# Patient Record
Sex: Female | Born: 1975 | Race: Asian | Hispanic: No | Marital: Married | Smoking: Never smoker
Health system: Southern US, Community
[De-identification: ages and names within clinical notes are randomized; demographics above are authoritative.]

## PROBLEM LIST (undated history)

## (undated) DIAGNOSIS — I10 Essential (primary) hypertension: Secondary | ICD-10-CM

## (undated) DIAGNOSIS — N159 Renal tubulo-interstitial disease, unspecified: Secondary | ICD-10-CM

---

## 2018-01-17 ENCOUNTER — Emergency Department (HOSPITAL_COMMUNITY): Payer: Self-pay

## 2018-01-17 ENCOUNTER — Emergency Department (HOSPITAL_COMMUNITY)
Admission: EM | Admit: 2018-01-17 | Discharge: 2018-01-17 | Disposition: A | Discharge status: Home / Self Care | Payer: Self-pay | Attending: Emergency Medicine | Admitting: Emergency Medicine

## 2018-01-17 ENCOUNTER — Encounter (HOSPITAL_COMMUNITY): Payer: Self-pay | Admitting: Emergency Medicine

## 2018-01-17 ENCOUNTER — Other Ambulatory Visit: Payer: Self-pay

## 2018-01-17 DIAGNOSIS — I10 Essential (primary) hypertension: Secondary | ICD-10-CM | POA: Insufficient documentation

## 2018-01-17 DIAGNOSIS — R0789 Other chest pain: Secondary | ICD-10-CM | POA: Insufficient documentation

## 2018-01-17 DIAGNOSIS — R51 Headache: Secondary | ICD-10-CM | POA: Insufficient documentation

## 2018-01-17 DIAGNOSIS — Z79899 Other long term (current) drug therapy: Secondary | ICD-10-CM | POA: Insufficient documentation

## 2018-01-17 DIAGNOSIS — R519 Headache, unspecified: Secondary | ICD-10-CM

## 2018-01-17 HISTORY — DX: Essential (primary) hypertension: I10

## 2018-01-17 HISTORY — DX: Renal tubulo-interstitial disease, unspecified: N15.9

## 2018-01-17 LAB — URINALYSIS, ROUTINE W REFLEX MICROSCOPIC
Bilirubin Urine: NEGATIVE
Glucose, UA: NEGATIVE mg/dL
Hgb urine dipstick: NEGATIVE
Ketones, ur: 5 mg/dL — AB
Leukocytes, UA: NEGATIVE
Nitrite: NEGATIVE
Protein, ur: NEGATIVE mg/dL
Specific Gravity, Urine: 1.012 (ref 1.005–1.030)
pH: 5 (ref 5.0–8.0)

## 2018-01-17 LAB — CBC
HCT: 44.8 % (ref 36.0–46.0)
Hemoglobin: 13.8 g/dL (ref 12.0–15.0)
MCH: 26.5 pg (ref 26.0–34.0)
MCHC: 30.8 g/dL (ref 30.0–36.0)
MCV: 86 fL (ref 80.0–100.0)
Platelets: 281 10*3/uL (ref 150–400)
RBC: 5.21 MIL/uL — ABNORMAL HIGH (ref 3.87–5.11)
RDW: 11.9 % (ref 11.5–15.5)
WBC: 8.4 10*3/uL (ref 4.0–10.5)
nRBC: 0 % (ref 0.0–0.2)

## 2018-01-17 LAB — DIFFERENTIAL
Abs Immature Granulocytes: 0.02 10*3/uL (ref 0.00–0.07)
Basophils Absolute: 0 10*3/uL (ref 0.0–0.1)
Basophils Relative: 0 %
EOS ABS: 0 10*3/uL (ref 0.0–0.5)
EOS PCT: 0 %
Immature Granulocytes: 0 %
LYMPHS ABS: 1.6 10*3/uL (ref 0.7–4.0)
LYMPHS PCT: 19 %
MONOS PCT: 5 %
Monocytes Absolute: 0.4 10*3/uL (ref 0.1–1.0)
Neutro Abs: 6.4 10*3/uL (ref 1.7–7.7)
Neutrophils Relative %: 76 %

## 2018-01-17 LAB — COMPREHENSIVE METABOLIC PANEL
ALK PHOS: 58 U/L (ref 38–126)
ALT: 11 U/L (ref 0–44)
ANION GAP: 9 (ref 5–15)
AST: 19 U/L (ref 15–41)
Albumin: 4.3 g/dL (ref 3.5–5.0)
BILIRUBIN TOTAL: 0.6 mg/dL (ref 0.3–1.2)
BUN: 10 mg/dL (ref 6–20)
CALCIUM: 9.3 mg/dL (ref 8.9–10.3)
CO2: 24 mmol/L (ref 22–32)
Chloride: 104 mmol/L (ref 98–111)
Creatinine, Ser: 0.76 mg/dL (ref 0.44–1.00)
GFR calc Af Amer: >60 mL/min (ref >60)
Glucose, Bld: 117 mg/dL — ABNORMAL HIGH (ref 70–99)
POTASSIUM: 3.4 mmol/L — AB (ref 3.5–5.1)
Sodium: 137 mmol/L (ref 135–145)
TOTAL PROTEIN: 7.5 g/dL (ref 6.5–8.1)

## 2018-01-17 LAB — I-STAT TROPONIN, ED
TROPONIN I, POC: 0 ng/mL (ref 0.00–0.08)
Troponin i, poc: 0 ng/mL (ref 0.00–0.08)

## 2018-01-17 LAB — APTT: aPTT: 30 seconds (ref 24–36)

## 2018-01-17 LAB — PROTIME-INR
INR: 1.01
PROTHROMBIN TIME: 13.2 s (ref 11.4–15.2)

## 2018-01-17 LAB — I-STAT BETA HCG BLOOD, ED (MC, WL, AP ONLY): I-stat hCG, quantitative: <5 m[IU]/mL (ref <5)

## 2018-01-17 LAB — TSH: TSH: 0.971 u[IU]/mL (ref 0.350–4.500)

## 2018-01-17 LAB — LIPASE, BLOOD: Lipase: 23 U/L (ref 11–51)

## 2018-01-17 MED ORDER — GI COCKTAIL ~~LOC~~
30.0000 mL | Freq: Once | ORAL | Status: AC
  Administered 2018-01-17: 30 mL via ORAL
  Filled 2018-01-17: qty 30

## 2018-01-17 MED ORDER — SODIUM CHLORIDE 0.9 % IV BOLUS
1000.0000 mL | Freq: Once | INTRAVENOUS | Status: AC
  Administered 2018-01-17: 1000 mL via INTRAVENOUS

## 2018-01-17 MED ORDER — KETOROLAC TROMETHAMINE 30 MG/ML IJ SOLN
30.0000 mg | Freq: Once | INTRAMUSCULAR | Status: AC
  Administered 2018-01-17: 30 mg via INTRAVENOUS
  Filled 2018-01-17: qty 1

## 2018-01-17 NOTE — ED Provider Notes (Signed)
[facility] EMERGENCY DEPARTMENT Provider Note   CSN: [id number] Arrival date & time: 01/17/18  1618     History   Chief Complaint Chief Complaint  Patient presents with  . Hypertension  . Numbness    HPI Sokha Craker is a 42 y.o. female with history of hypertension presents for evaluation of gradual onset, progressively worsening headache, chest pain, and abdominal pain at around 4 AM this morning.  She notes a burning pain to the upper abdomen radiating up to the left side of the chest and left shoulder.  She also notes some shortness of breath that has worsened throughout the day.  Headache is in the occipital region radiating to the forehead and is described as an aching pressure.  She states she feels as though her head is "heavy ". Headache is more severe than headaches she has experienced previously. Denies blurry vision or double vision but does note seeing bright flashes of light when she closes her eyes.  Also notes numbness of the left handShe endorses nausea but no vomiting.  She has been on Keflex for the past 8 days due to a UTI.  No urinary symptoms at this time.  She took an aspirin earlier today without significant relief in her symptoms.  She feels as though her heart is racing.  She states this has been an ongoing problem for several months since an admission to the hospital in [facility] in which she was given an unknown medication and her heart rate increased to 150 bpm.  She does take blood pressure medicine but ran out of it 2 or 3 days ago.  She does not remember what medication she is on.  She denies alcohol use, recreational drug use, no significant caffeine intake.  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  The history is provided by the patient and the spouse. The history is limited by a language barrier. A language interpreter was used.    Past Medical History:  Diagnosis Date  .  Hypertension   . Kidney infection     There are no active problems to display for this patient.   History reviewed. No pertinent surgical history.   OB History   None      Home Medications    Prior to Admission medications   Medication Sig Start Date End Date Taking? Authorizing Provider  acetaminophen (TYLENOL) 500 MG tablet Take 1,000 mg by mouth every 6 (six) hours as needed for headache.   Yes [provider]  aspirin EC 81 MG tablet Take 81 mg by mouth daily.   Yes [provider]  cephALEXin (KEFLEX) 500 MG capsule Take 500 mg by mouth every 6 (six) hours. 01/08/18  Yes [provider]  Pseudoephedrine-APAP-DM (DAYQUIL MULTI-SYMPTOM COLD/FLU PO) Take 2 tablets by mouth daily.   Yes [provider]    Family History No family history on file.  Social History Social History   Tobacco Use  . Smoking status: Never Smoker  . Smokeless tobacco: Never Used  Substance Use Topics  . Alcohol use: Never    Frequency: Never  . Drug use: Never     Allergies   Omeprazole   Review of Systems Review of Systems  Constitutional: Negative for chills and fever.  Eyes: Positive for visual disturbance. Negative for photophobia and pain.  Respiratory: Positive for shortness of breath.   Cardiovascular: Positive for chest pain and palpitations. Negative for leg swelling.  Gastrointestinal:  Positive for abdominal pain and nausea. Negative for vomiting.  Genitourinary: Negative for dysuria, hematuria and urgency.  Neurological: Positive for numbness and headaches.  All other systems reviewed and are negative.    Physical Exam Updated Vital Signs BP (!) 142/102   Pulse 97   Temp 98.9 F (37.2 C) (Oral)   Resp 13   Ht 5\' 4"  (1.626 m)   Wt 63.5 kg   LMP 01/03/2018   SpO2 100%   BMI 24.03 kg/m   Physical Exam  Constitutional: She appears well-developed and well-nourished. No distress.  HENT:  Head: Normocephalic and atraumatic.    Eyes: Pupils are equal, round, and reactive to light. Conjunctivae and EOM are normal. Right eye exhibits no discharge. Left eye exhibits no discharge.  Neck: Normal range of motion. Neck supple. No JVD present. No tracheal deviation present.  No midline spine TTP, no deformity, crepitus, or step-off noted. Mild tenderness to palpation at the splenius capitis insertion point bilaterally along the occipital region   Cardiovascular: Normal rate, regular rhythm, normal heart sounds and intact distal pulses.  2+ radial and DP/PT pulses bilaterally, Homans sign absent bilaterally, no lower extremity edema, no palpable cords, compartments are soft   Pulmonary/Chest: Effort normal and breath sounds normal. She exhibits no tenderness.  Abdominal: Soft. Bowel sounds are normal. She exhibits no distension. There is tenderness in the epigastric area. There is no rigidity, no rebound, no guarding and no CVA tenderness.  Musculoskeletal: She exhibits no edema.  No midline spine TTP, no paraspinal muscle tenderness, no deformity, crepitus, or step-off noted.  5/5 strength of BUE and BLE major muscle groups.  Neurological: She is alert. No cranial nerve deficit or sensory deficit. She exhibits normal muscle tone. Coordination normal.  Mental Status:  Alert, thought content appropriate, able to give a coherent history. Speech fluent without evidence of aphasia. Able to follow 2 step commands without difficulty.  Cranial Nerves:  II:  Peripheral visual fields grossly normal, pupils equal, round, reactive to light III,IV, VI: ptosis not present, extra-ocular motions intact bilaterally  V,VII: smile symmetric, facial light touch sensation equal VIII: hearing grossly normal to voice  X: uvula elevates symmetrically  XI: bilateral shoulder shrug symmetric and strong XII: midline tongue extension without fassiculations Motor:  Normal tone. 5/5 strength of BUE and BLE major muscle groups including strong and equal  grip strength and dorsiflexion/plantar flexion Sensory: light touch normal in all extremities. Cerebellar: normal finger-to-nose with bilateral upper extremities Gait: normal gait and balance. Able to walk on toes and heels with ease.  No pronator drift, Romberg sign absent   Skin: Skin is warm and dry. No erythema.  Psychiatric: She has a normal mood and affect. Her behavior is normal.  Nursing note and vitals reviewed.    ED Treatments / Results  Labs (all labs ordered are listed, but only abnormal results are displayed) Labs Reviewed  CBC - Abnormal; Notable for the following components:      Result Value   RBC 5.21 (*)    All other components within normal limits  COMPREHENSIVE METABOLIC PANEL - Abnormal; Notable for the following components:   Potassium 3.4 (*)    Glucose, Bld 117 (*)    All other components within normal limits  URINALYSIS, ROUTINE W REFLEX MICROSCOPIC - Abnormal; Notable for the following components:   Color, Urine STRAW (*)    Ketones, ur 5 (*)    All other components within normal limits  PROTIME-INR  APTT  DIFFERENTIAL  TSH  LIPASE, BLOOD  I-STAT TROPONIN, ED  I-STAT BETA HCG BLOOD, ED ([facility], [facility], [facility] ONLY)  I-STAT TROPONIN, ED    EKG EKG Interpretation  Date/Time:  Sunday January 17 2018 16:38:13 EDT Ventricular Rate:  97 PR Interval:  152 QRS Duration: 78 QT Interval:  362 QTC Calculation: 459 R Axis:   56 Text Interpretation:  Normal sinus rhythm Nonspecific T wave abnormality Abnormal ECG No previous ECGs available Confirmed by Vanetta Mulders [phone]) on 01/17/2018 4:47:42 PM   Radiology Dg Chest 2 View  Result Date: 01/17/2018 CLINICAL DATA:  Chest pain.  High blood pressure. EXAM: CHEST - 2 VIEW COMPARISON:  None. FINDINGS: The heart size is within normal limits. Ectasia of thoracic aorta noted. Both lungs are clear. The visualized skeletal structures are unremarkable. IMPRESSION: No active cardiopulmonary disease. Electronically  Signed   By: Myles Rosenthal M.D.   On: 01/17/2018 19:31   Ct Head Wo Contrast  Result Date: 01/17/2018 CLINICAL DATA:  C/o blood pressure 178/ at home. States she has had issues with BP being elevated over the past 2-3 months. Not taking BP meds. Also reports headache/ "heavines to head" over the past 2 days. Reports numbness to L hand since waking up at 4am. No arm drift. Speech clear. EXAM: CT HEAD WITHOUT CONTRAST TECHNIQUE: Contiguous axial images were obtained from the base of the skull through the vertex without intravenous contrast. COMPARISON:  None. FINDINGS: Brain: No evidence of acute infarction, hemorrhage, hydrocephalus, extra-axial collection or mass lesion/mass effect. Vascular: No hyperdense vessel or unexpected calcification. Skull: Normal. Negative for fracture or focal lesion. Sinuses/Orbits: Globes and orbits are unremarkable. Visualized sinuses and mastoid air cells are clear. Other: None. IMPRESSION: Normal unenhanced CT scan of the brain. Electronically Signed   By: Amie Portland M.D.   On: 01/17/2018 17:00    Procedures Procedures (including critical care time)  Medications Ordered in ED Medications  gi cocktail (Maalox,Lidocaine,Donnatal) (30 mLs Oral Given 01/17/18 1848)  ketorolac (TORADOL) 30 MG/ML injection 30 mg (30 mg Intravenous Given 01/17/18 1848)  sodium chloride 0.9 % bolus 1,000 mL (0 mLs Intravenous Stopped 01/17/18 2119)     Initial Impression / Assessment and Plan / ED Course  I have reviewed the triage vital signs and the nursing notes.  Pertinent labs & imaging results that were available during my care of the patient were reviewed by me and considered in my medical decision making (see chart for details).     Patient with complaint of headache, epigastric abdominal pain and chest pain with some shortness of breath beginning earlier today.  She is afebrile, hypertensive in the ED but has been out of her blood pressure medicines for the past 3 days.   She is traveling from her home town in [place] visiting her in-laws.  She is nontoxic in appearance.  No focal neurologic deficits and normal neurologic exam on my assessment.  Lab work reassuring with no leukocytosis, no significant anemia, no metabolic derangements.  UA does not suggest UTI or nephrolithiasis. TSH within normal limits.  Chest x-ray shows no acute cardiopulmonary abnormalities.  EKG shows normal sinus rhythm with no ischemic changes.  Serial troponins are negative. HEART score is 2 and she is low risk for cardiac disease.  Doubt pericarditis, myocarditis, dissection, cardiac tamponade, PE, or acute surgical abdominal pathology.  Head CT shows no intracranial abnormalities.  Doubt CVA given normal neuro exam.  Her symptoms are suggestive of migraine and she tells me that she has  headaches frequently similar to the one she is experiencing today though it does not usually include seeing bright flashes of light.  She had significant improvement in her chest pain and headache with Toradol, IV fluids, and a GI cocktail.  No evidence of hypertensive urgency or emergency and no endorgan damage noted on work-up today.  No further emergent work-up required at this time.  Stable for discharge home with follow-up with PCP for reevaluation.  Discussed the possibility of Holter monitoring for further evaluation of her palpitations though she has not had any arrhythmia on telemetry while in the ED today.  Also recommend eventual follow-up with a neurologist for evaluation and management of her headaches.  Discussed strict ED return precautions.  Patient and patient's husband verbalized understanding of and agreement with plan and patient is stable for discharge home at this time.  Translator was used throughout the encounter. Final Clinical Impressions(s) / ED Diagnoses   Final diagnoses:  Hypertension, unspecified type  Bad headache  Atypical chest pain    ED Discharge Orders    None        Jeanie Sewer, PA-C 01/18/18 0106    Vanetta Mulders, MD 01/26/18 [phone]

## 2018-01-17 NOTE — ED Triage Notes (Signed)
C/o blood pressure 178/ at home.  States she has had issues with BP being elevated over the past 2-3 months.  Not taking BP meds.  Also reports headache/ "heavines to head" over the past 2 days.  Reports numbness to L hand since waking up at 4am.  No arm drift.  Speech clear.

## 2018-01-17 NOTE — Discharge Instructions (Signed)
Drink plenty of water and get plenty of rest.  You can take Tylenol or ibuprofen as needed for your headache or chest pains.  Follow-up with your primary care physician for refills of your blood pressure medicine and reevaluation of your headaches and fast heart rate.  They can do Holter monitoring for a month to check to see if your heart rate goes into an abnormal rhythm in that time.  They can also put you on medicines that can help with your headaches.  Return to the emergency department immediately if any concerning signs or symptom such as worsening pains, shortness of breath, or persistent vomiting, or high fevers.  If your blood pressure (BP) was elevated on multiple readings during this visit above 130 for the top number or above 80 for the bottom number, please have this repeated by your primary care provider within one month. You can also check your blood pressure when you are out at a pharmacy or grocery store. Many have machines that will check your blood pressure.  If your blood pressure remains elevated, please follow-up with your PCP.

## 2018-01-17 NOTE — ED Notes (Signed)
Visual acuity test. Right eye 20/16, left eye 20/16. Both eyes 20/16

## 2018-01-17 NOTE — ED Notes (Signed)
Lab will add-on lipase 

## 2020-06-07 IMAGING — CT CT HEAD W/O CM
3 of 4 series · 16 of 47 positions shown, 19 images · non-contrast
Comparison: None.

CLINICAL DATA: C/o blood pressure 178/ at home. States she has had
issues with BP being elevated over the past 2-3 months. Not taking
BP meds. Also reports headache/ "heavines to head" over the past 2
days. Reports numbness to L hand since waking up at 4am. No arm
drift. Speech clear.

EXAM:
CT HEAD WITHOUT CONTRAST
TECHNIQUE: Contiguous axial images were obtained from the base of the skull
through the vertex without intravenous contrast.

[Series 4: head 2.0 h70h · axial · 0.40mm/px · z∈[-153,-3]mm · 10 of 85 slices shown, 13 images]
[im 5/85  brain]
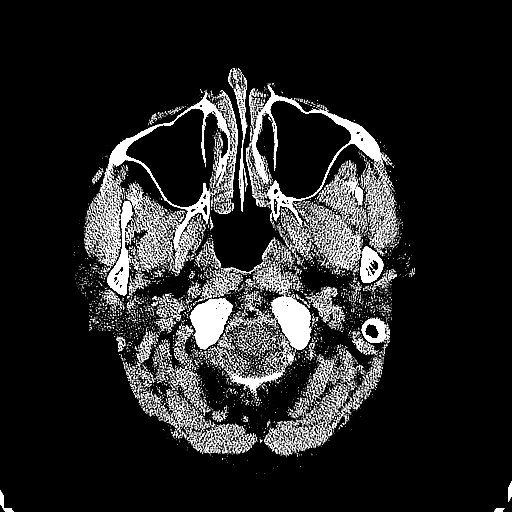
[im 5/85  bone]
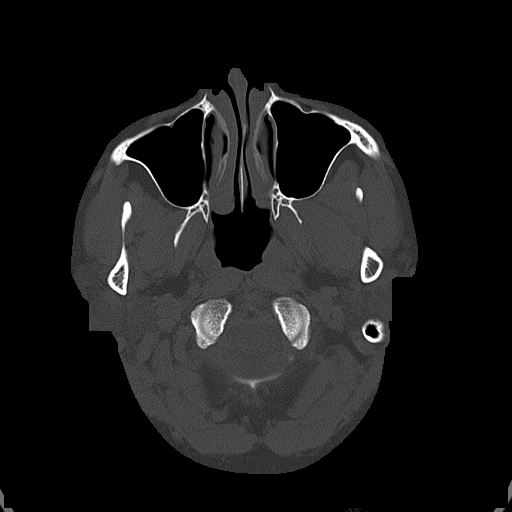
[im 13/85  brain]
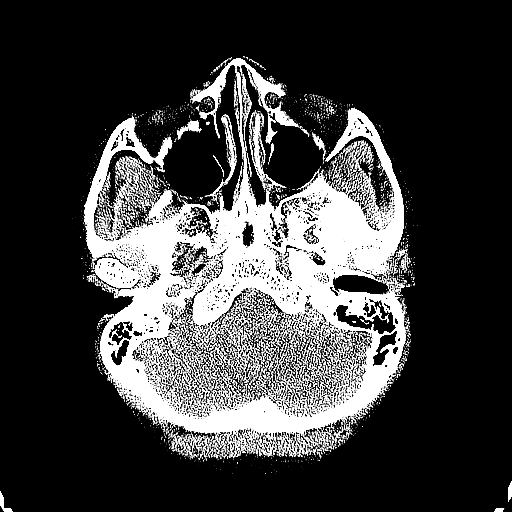
[im 22/85  brain]
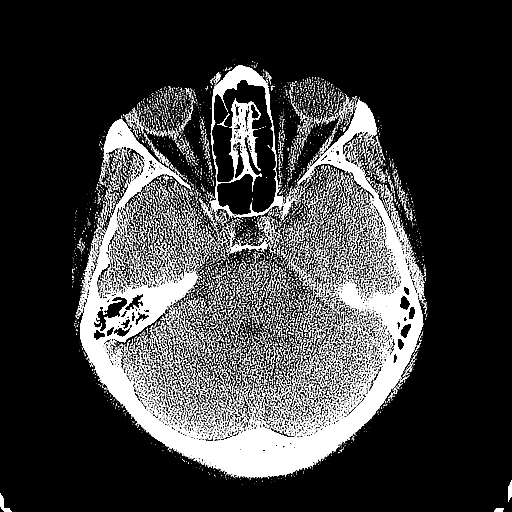
[im 30/85  brain]
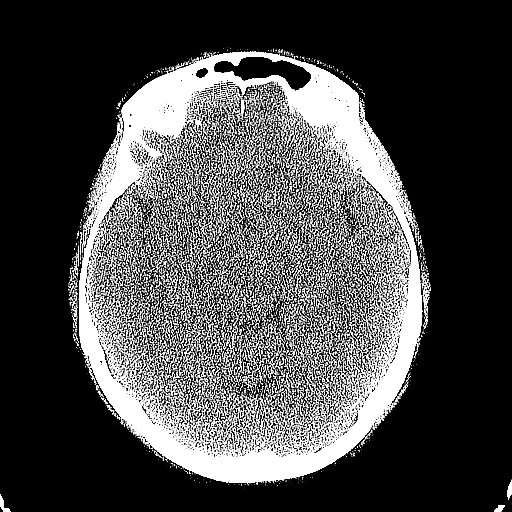
[im 38/85  brain]
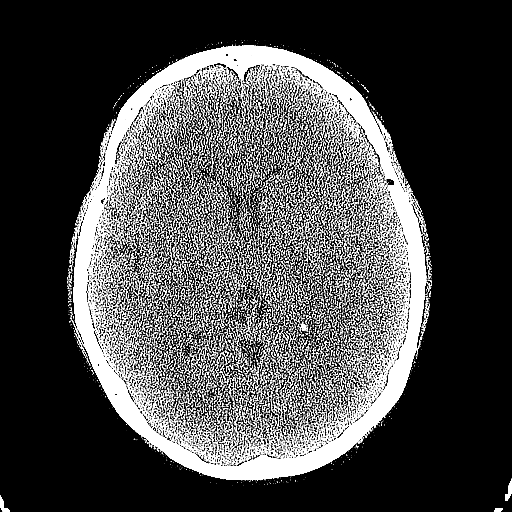
[im 38/85  bone]
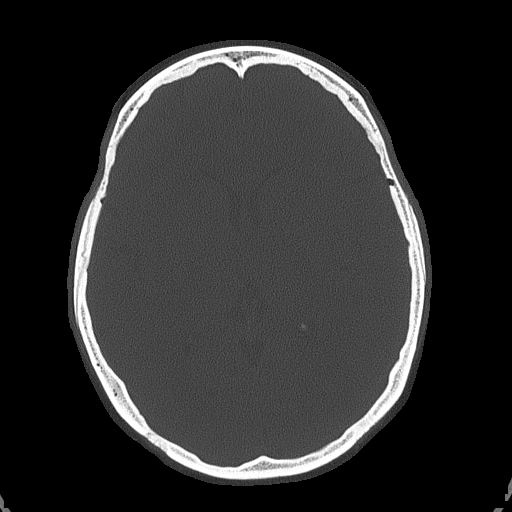
[im 47/85  brain]
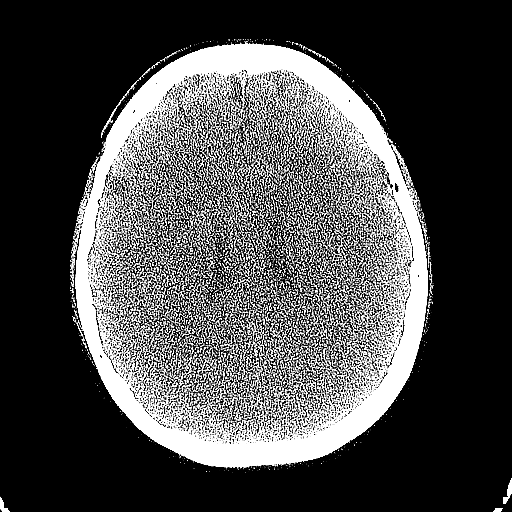
[im 55/85  brain]
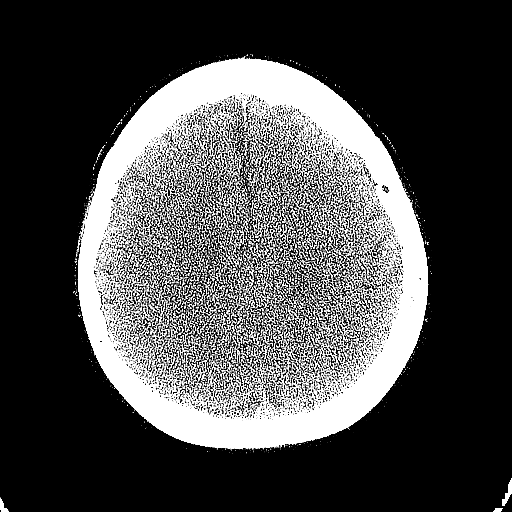
[im 64/85  brain]
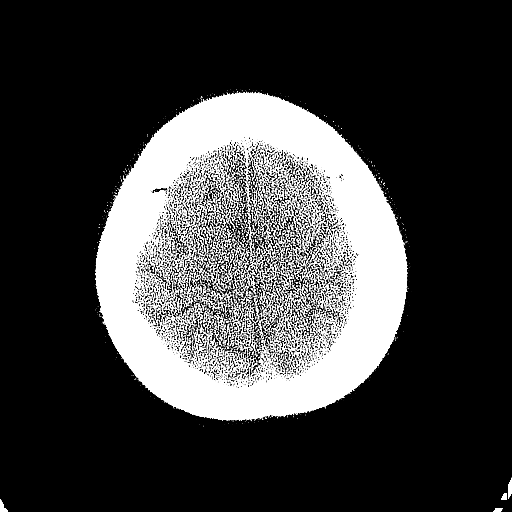
[im 72/85  brain]
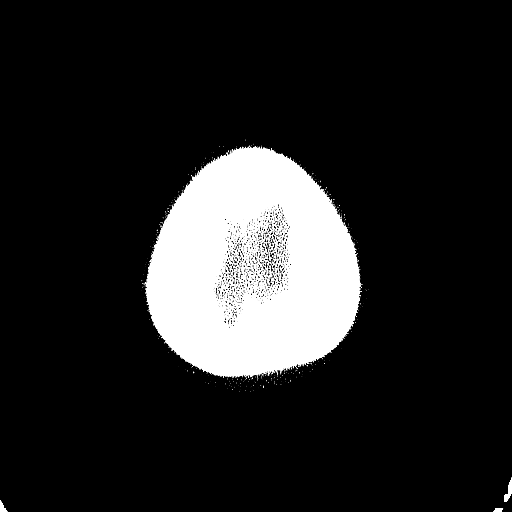
[im 72/85  bone]
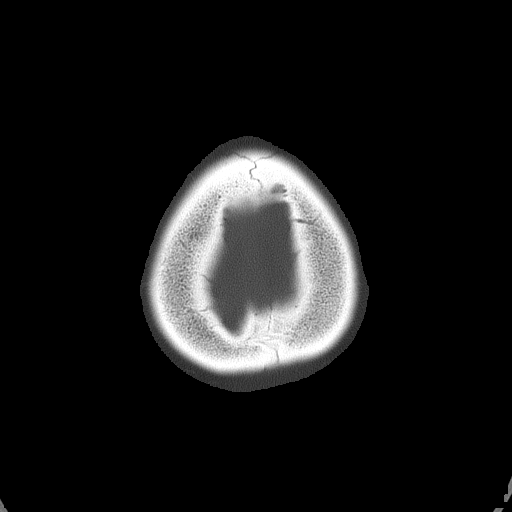
[im 80/85  brain]
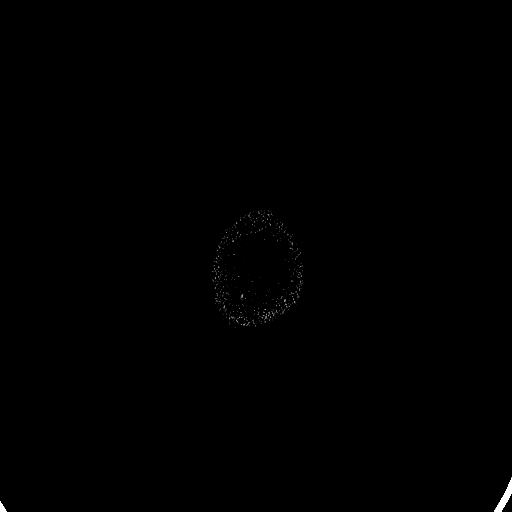

[Series 5: head 3.0 mpr cor · coronal · 0.33mm/px · 3 of 67 slices shown]
[im 23/67  brain]
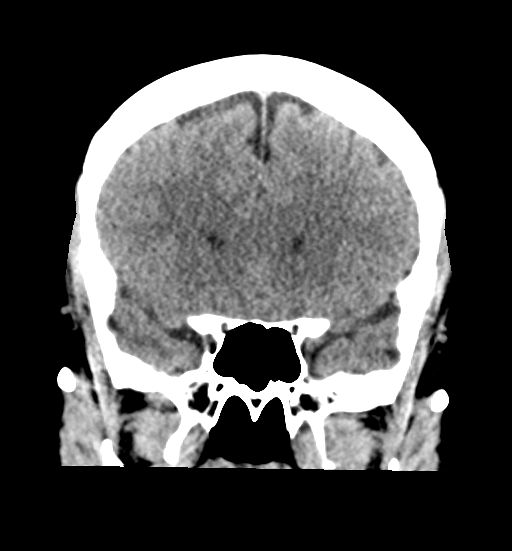
[im 30/67  brain]
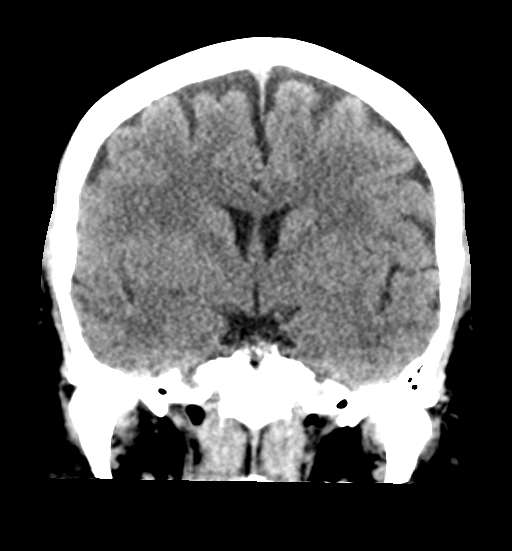
[im 37/67  brain]
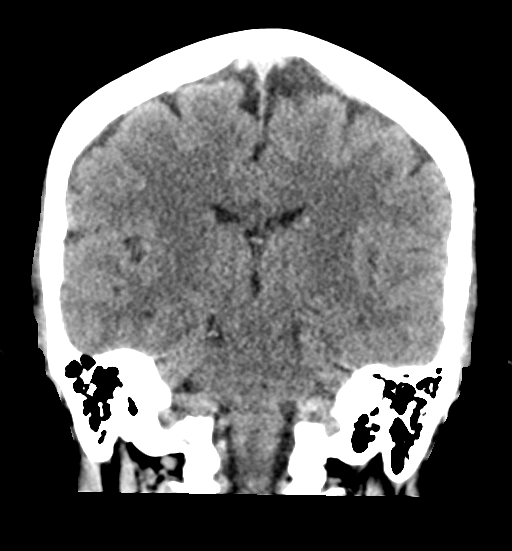

[Series 6: head 3.0 mpr sag · sagittal · 0.38mm/px · 3 of 60 slices shown]
[im 20/60  brain]
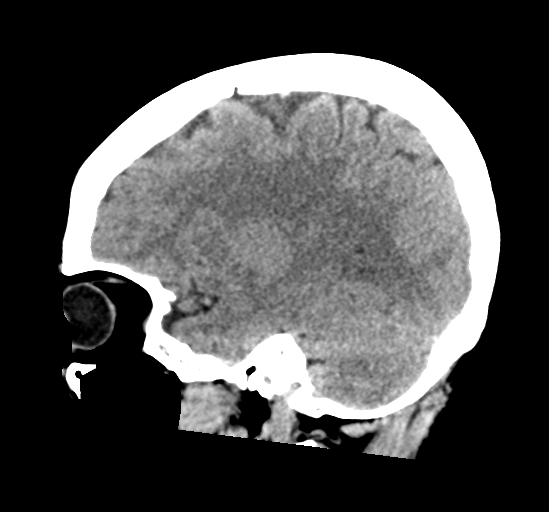
[im 30/60  brain]
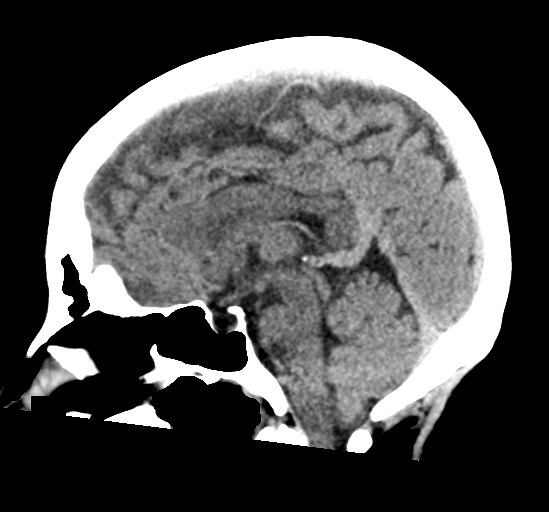
[im 40/60  brain]
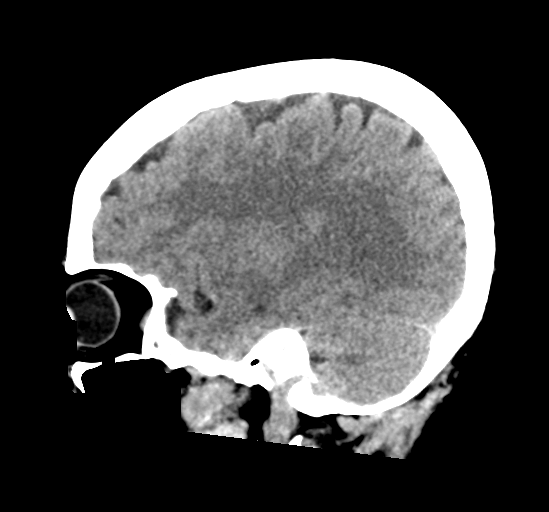

[16 of 47 positions shown; findings below may reference images not displayed]

FINDINGS: Brain: No evidence of acute infarction, hemorrhage, hydrocephalus,
extra-axial collection or mass lesion/mass effect.

Vascular: No hyperdense vessel or unexpected calcification.

Skull: Normal. Negative for fracture or focal lesion.

Sinuses/Orbits: Globes and orbits are unremarkable. Visualized
sinuses and mastoid air cells are clear.

Other: None.
IMPRESSION: Normal unenhanced CT scan of the brain.
